# Patient Record
Sex: Female | Born: 1995 | Race: White | Hispanic: No | Marital: Married
Health system: Southern US, Academic
[De-identification: ages and names within clinical notes are randomized; demographics above are authoritative.]

## PROBLEM LIST (undated history)

## (undated) DIAGNOSIS — R6889 Other general symptoms and signs: Secondary | ICD-10-CM

## (undated) DIAGNOSIS — R233 Spontaneous ecchymoses: Secondary | ICD-10-CM

## (undated) DIAGNOSIS — T753XXA Motion sickness, initial encounter: Secondary | ICD-10-CM

## (undated) DIAGNOSIS — F419 Anxiety disorder, unspecified: Secondary | ICD-10-CM

## (undated) DIAGNOSIS — Z973 Presence of spectacles and contact lenses: Secondary | ICD-10-CM

## (undated) DIAGNOSIS — F4310 Post-traumatic stress disorder, unspecified: Secondary | ICD-10-CM

## (undated) DIAGNOSIS — J45909 Unspecified asthma, uncomplicated: Secondary | ICD-10-CM

## (undated) DIAGNOSIS — T8853XA Unintended awareness under general anesthesia during procedure, initial encounter: Secondary | ICD-10-CM

## (undated) DIAGNOSIS — F410 Panic disorder [episodic paroxysmal anxiety] without agoraphobia: Secondary | ICD-10-CM

## (undated) HISTORY — PX: HX LAP CHOLECYSTECTOMY: SHX56

## (undated) HISTORY — PX: HX UPPER ENDOSCOPY: 2100001144

## (undated) HISTORY — PX: HX WISDOM TEETH EXTRACTION: SHX21

## (undated) HISTORY — PX: HX SKIN BIOPSY: SHX1

---
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
## 2019-09-29 NOTE — ED Provider Notes (Signed)
History     Chief Complaint   Patient presents with   . Allergic Reaction     Pt. reports to UC with c/o possible allergic reaction to vegan meat that she reports she consumed at 1 pm today; Reports x 15-30 minutes ago eating, her upper lip began to feel numb and she reports noticing worsening swelling over the past few hours;      Patient is a 25 year old female who is here today for evaluation of some swelling to her lip She says ate some vegitarian meat earlier today and felt a little funny after, felt like throat was full, She says took a claritin because is all that she had, She called her sister who is a Engineer, civil (consulting) and told her to just monitor her symptoms, She says about an hour ago she felt like her lip was swelling more, She is not having any difficulty breathing or swallowing , no skin rashes She has not had any new medications She has never had something like this before          Medical/Surgical/Family History     History reviewed. No pertinent past medical history.     There is no problem list on file for this patient.           History reviewed. No pertinent surgical history.  No family history on file.       Social History     Tobacco Use   . Smoking status: Not on file   Substance Use Topics   . Alcohol use: Not on file   . Drug use: Not on file     Living Situation     Questions Responses    Patient lives with     Homeless     Caregiver for other family member     External Services     Employment     Domestic Violence Risk                  Review of Systems   Review of Systems   Constitutional: Negative for chills and fever.   HENT: Positive for facial swelling. Negative for congestion, trouble swallowing and voice change.    Respiratory: Negative for cough and shortness of breath.    Cardiovascular: Negative for chest pain and palpitations.   Skin: Negative for rash.       Physical Exam   Triage Vitals  Triage Start: Start, (09/27/19 2056)  First Recorded BP: 134/90, Resp: 18, Temp: 36.3 C (97.3  F), Temp src: TEMPORAL Oxygen Therapy SpO2: 99 %, Oximetry Source: Rt Hand, O2 Device: None (Room air), Heart Rate: 93, (09/27/19 4132)  .  First Pain Reported  0-10 Scale: 0, (09/27/19 4401)       Physical Exam  Vitals and nursing note reviewed.   Constitutional:       General: She is not in acute distress.     Appearance: Normal appearance. She is not ill-appearing.   HENT:      Head: Normocephalic.      Right Ear: Tympanic membrane, ear canal and external ear normal.      Left Ear: Tympanic membrane, ear canal and external ear normal.      Nose: Nose normal.      Mouth/Throat:      Mouth: Mucous membranes are moist.      Pharynx: Oropharynx is clear. No posterior oropharyngeal erythema.      Comments: Swelling to right upper lip  Eyes:  Extraocular Movements: Extraocular movements intact.      Pupils: Pupils are equal, round, and reactive to light.   Cardiovascular:      Rate and Rhythm: Normal rate and regular rhythm.      Pulses: Normal pulses.   Pulmonary:      Effort: Pulmonary effort is normal.      Breath sounds: Normal breath sounds.   Musculoskeletal:      Cervical back: Normal range of motion and neck supple.   Skin:     Findings: No rash.   Neurological:      General: No focal deficit present.      Mental Status: She is alert and oriented to person, place, and time.          Medical Decision Making        Initial Evaluation:  ED First Provider Contact     Date/Time Event User Comments    09/27/19 2052 ED First Provider Contact VITALE, MARIE Initial Face to Face Provider Contact            Patient was seen on: 09/27/2019        Assessment:  24 y.o.female comes [facility] the [facility] with some facial swelling after eating a new vegan meat    Differential Diagnosis includes:  Allergic reaction  Angioedema  Cellulitis    Plan: Patient given prednisone and benadry, Will continue prednisone for additional 4 days and patient advised to take benadryl for the next 2 days, She has been referred to Allergy,  She is advised to go to ER if swelling worsens or develops difficulty swallowing or breathing        Orders Placed This Encounter   . ED/UC REFERRAL TO ALLERGY/IMMUNOLOGY   . predniSONE (DELTASONE) tablet 40 mg   . diphenhydrAMINE (BENADRYL) oral solid 25 mg   . predniSONE (DELTASONE) 20 MG tablet       No results found for this or any previous visit (from the past 24 hour(s)).        Final Diagnosis    ICD-10-CM ICD-9-CM   1. Allergic reaction, initial encounter  T78.40XA 995.3   2. Angioedema, initial encounter  T78.3XXA 995.1       Encourage fluids, encourage rest, good hand hygiene.    Use over the counter medications as discussed.    Please start the new medications as below:    Current Discharge Medication List      New Medications    Details Last Dose Given Next Dose Due Script Given?   predniSONE (DELTASONE) 40 mg Dose: 40 mg  Take 40 mg by mouth daily  Quantity 8 tablet, Refill 0  Start date: 09/27/2019, End date: 10/01/2019       Comments: Emergency Encounter                   Please follow up with your physician as below:        Thank you Yvonne Merritt for coming to [facility] for your health care concerns.    If your condition changes and/or worsens please follow up with her primary doctor and/or return to the urgent care center.    If short of breath, chest pains or any other concerns please report to the emergency room.    In the event of an Emergency dial 911.    Final Diagnosis  Final diagnoses:   [T78.40XA] Allergic reaction, initial encounter (Primary)   [T78.3XXA] Angioedema, initial encounter  Delorise Jackson, MD              Delorise Jackson, MD  09/29/19 [phone]

## 2020-10-22 ENCOUNTER — Emergency Department
Admission: EM | Admit: 2020-10-22 | Discharge: 2020-10-23 | Disposition: A | Discharge status: Home / Self Care | Payer: PRIVATE HEALTH INSURANCE | Attending: Emergency Medicine | Admitting: Emergency Medicine

## 2020-10-22 ENCOUNTER — Encounter: Payer: Self-pay | Admitting: Emergency Medicine

## 2020-10-22 ENCOUNTER — Emergency Department: Payer: PRIVATE HEALTH INSURANCE

## 2020-10-22 DIAGNOSIS — W138XXA Fall from, out of or through other building or structure, initial encounter: Secondary | ICD-10-CM | POA: Diagnosis not present

## 2020-10-22 DIAGNOSIS — S060X0A Concussion without loss of consciousness, initial encounter: Secondary | ICD-10-CM | POA: Diagnosis not present

## 2020-10-22 DIAGNOSIS — S0990XA Unspecified injury of head, initial encounter: Secondary | ICD-10-CM | POA: Diagnosis present

## 2020-10-22 MED ORDER — IBUPROFEN 800 MG PO TABS
800.0000 mg | ORAL_TABLET | Freq: Once | ORAL | Status: AC
  Administered 2020-10-23: 800 mg via ORAL
  Filled 2020-10-22: qty 1

## 2020-10-22 NOTE — ED Triage Notes (Signed)
Pt fell at 1430, off a performance stage, approx 83ft fall, hitting the posterior head. Denies LOC. Pt to ED due to pain and discomfort in neck and head. Denies blurred vision.

## 2020-10-22 NOTE — ED Provider Notes (Signed)
[facility] EMERGENCY DEPARTMENT Provider Note   CSN: [id number] Arrival date & time: 10/22/20  2200     History Chief Complaint  Patient presents with  . Head Injury    Sandra Yu is a 25 y.o. female presents to the emergency department for evaluation of head injury that occurred at approximately 2:30 PM today.  Patient states she was on a stage proximately 3 feet high, fell backwards after stepping off the edge and landed on her buttocks/back and hit her head.  She denies any back lower extremity or upper extremity discomfort.  She complains of a mild headache with mild nausea.  No LOC or vomiting.  She has not had a medications for her headache.  She does have a little bit of neck tightness but no radicular symptoms in the upper extremities.  HPI     History reviewed. No pertinent past medical history.  There are no problems to display for this patient.   History reviewed. No pertinent surgical history.   OB History   No obstetric history on file.     History reviewed. No pertinent family history.  Social History   Tobacco Use  . Smoking status: Never Smoker  . Smokeless tobacco: Never Used    Home Medications Prior to Admission medications   Not on File    Allergies    Patient has no known allergies.  Review of Systems   Review of Systems  Constitutional: Negative for fever.  Eyes: Positive for photophobia. Negative for visual disturbance.  Respiratory: Negative for shortness of breath.   Cardiovascular: Negative for chest pain.  Gastrointestinal: Positive for nausea. Negative for vomiting.  Musculoskeletal: Positive for myalgias and neck pain. Negative for arthralgias and back pain.  Skin: Negative for rash and wound.  Neurological: Positive for headaches. Negative for dizziness, tremors, syncope, facial asymmetry, light-headedness and numbness.    Physical Exam Updated Vital Signs BP (!) 124/91 (BP Location: Right  Arm)   Pulse 88   Temp 97.9 F (36.6 C) (Oral)   Resp 18   SpO2 100%   Physical Exam Constitutional:      Appearance: She is well-developed.  HENT:     Head: Normocephalic and atraumatic.     Comments: No bruising or tenderness to palpation.    Right Ear: External ear normal.     Left Ear: External ear normal.  Eyes:     Extraocular Movements: Extraocular movements intact.     Conjunctiva/sclera: Conjunctivae normal.     Pupils: Pupils are equal, round, and reactive to light.  Cardiovascular:     Rate and Rhythm: Normal rate.     Pulses: Normal pulses.     Heart sounds: Normal heart sounds.  Pulmonary:     Effort: Pulmonary effort is normal. No respiratory distress.  Abdominal:     General: There is no distension.     Tenderness: There is no abdominal tenderness. There is no guarding.  Musculoskeletal:        General: Normal range of motion.     Cervical back: Normal range of motion and neck supple. No rigidity or tenderness.  Skin:    General: Skin is warm.     Findings: No rash.  Neurological:     General: No focal deficit present.     Mental Status: She is alert and oriented to person, place, and time. Mental status is at baseline.     Cranial Nerves: No cranial nerve deficit.  Motor: No weakness.     Gait: Gait normal.  Psychiatric:        Mood and Affect: Mood normal.        Behavior: Behavior normal.        Thought Content: Thought content normal.     ED Results / Procedures / Treatments   Labs (all labs ordered are listed, but only abnormal results are displayed) Labs Reviewed - No data to display  EKG None  Radiology CT Head Wo Contrast  Result Date: 10/22/2020 CLINICAL DATA:  Head trauma during a fall, striking the posterior head. Pain in the neck and head. EXAM: CT HEAD WITHOUT CONTRAST CT CERVICAL SPINE WITHOUT CONTRAST TECHNIQUE: Multidetector CT imaging of the head and cervical spine was performed following the standard protocol without  intravenous contrast. Multiplanar CT image reconstructions of the cervical spine were also generated. COMPARISON:  None. FINDINGS: CT HEAD FINDINGS Brain: No evidence of acute infarction, hemorrhage, hydrocephalus, extra-axial collection or mass lesion/mass effect. Vascular: No hyperdense vessel or unexpected calcification. Skull: The calvarium appears intact. Sinuses/Orbits: Paranasal sinuses and mastoid air cells are clear. Other: None. CT CERVICAL SPINE FINDINGS Alignment: Reversal of the usual cervical lordosis without anterior subluxation. This may be positional but muscle spasm could also have this appearance. Normal alignment of the posterior elements. C1-2 articulation appears intact. Skull base and vertebrae: No acute fracture. No primary bone lesion or focal pathologic process. Soft tissues and spinal canal: No prevertebral fluid or swelling. No visible canal hematoma. Disc levels:  Intervertebral disc space heights are normal. Upper chest: Visualized lung apices are clear. Other: None. IMPRESSION: 1. No acute intracranial abnormalities. 2. Nonspecific reversal of the usual cervical lordosis. No acute displaced fractures identified. Electronically Signed   By: Burman Nieves M.D.   On: 10/22/2020 23:02   CT Cervical Spine Wo Contrast  Result Date: 10/22/2020 CLINICAL DATA:  Head trauma during a fall, striking the posterior head. Pain in the neck and head. EXAM: CT HEAD WITHOUT CONTRAST CT CERVICAL SPINE WITHOUT CONTRAST TECHNIQUE: Multidetector CT imaging of the head and cervical spine was performed following the standard protocol without intravenous contrast. Multiplanar CT image reconstructions of the cervical spine were also generated. COMPARISON:  None. FINDINGS: CT HEAD FINDINGS Brain: No evidence of acute infarction, hemorrhage, hydrocephalus, extra-axial collection or mass lesion/mass effect. Vascular: No hyperdense vessel or unexpected calcification. Skull: The calvarium appears intact.  Sinuses/Orbits: Paranasal sinuses and mastoid air cells are clear. Other: None. CT CERVICAL SPINE FINDINGS Alignment: Reversal of the usual cervical lordosis without anterior subluxation. This may be positional but muscle spasm could also have this appearance. Normal alignment of the posterior elements. C1-2 articulation appears intact. Skull base and vertebrae: No acute fracture. No primary bone lesion or focal pathologic process. Soft tissues and spinal canal: No prevertebral fluid or swelling. No visible canal hematoma. Disc levels:  Intervertebral disc space heights are normal. Upper chest: Visualized lung apices are clear. Other: None. IMPRESSION: 1. No acute intracranial abnormalities. 2. Nonspecific reversal of the usual cervical lordosis. No acute displaced fractures identified. Electronically Signed   By: Burman Nieves M.D.   On: 10/22/2020 23:02    Procedures Procedures   Medications Ordered in ED Medications  ibuprofen (ADVIL) tablet 800 mg (has no administration in time range)    ED Course  I have reviewed the triage vital signs and the nursing notes.  Pertinent labs & imaging results that were available during my care of the patient were reviewed by me and  considered in my medical decision making (see chart for details).    MDM Rules/Calculators/A&P                          25 year old female with mild concussion after accidentally stepping off a stage and falling backwards hitting her head.  No LOC or vomiting.  She has had mild nausea.  Slight headache is exacerbated with looking at her phone/screen.  Symptoms improved by discontinuing looking at a screen.  She is given ibuprofen.  She is encouraged to alternate Tylenol and ibuprofen.  She is educated on concussion and treatment/things to avoid.  She understands signs and symptoms return to the ER for.   Final Clinical Impression(s) / ED Diagnoses Final diagnoses:  Concussion without loss of consciousness, initial encounter     Rx / DC Orders ED Discharge Orders    None       Ronnette Juniper 10/22/20 2358    Dionne Bucy, MD 10/23/20 0020

## 2020-10-22 NOTE — Discharge Instructions (Signed)
Please alternate Tylenol and ibuprofen as needed for headache.  Avoid bright lights, screen time, loud sounds.  Do not drive for 24 hours.  Return to the ER for any sudden onset of severe headache, vomiting, vision changes, worsening symptoms or urgent changes in her health.

## 2021-06-13 DIAGNOSIS — D649 Anemia, unspecified: Secondary | ICD-10-CM | POA: Insufficient documentation

## 2022-07-02 ENCOUNTER — Emergency Department (HOSPITAL_COMMUNITY): Payer: Self-pay

## 2022-07-02 ENCOUNTER — Emergency Department (EMERGENCY_DEPARTMENT_HOSPITAL): Payer: BC Managed Care – PPO

## 2022-07-02 ENCOUNTER — Emergency Department
Admission: EM | Admit: 2022-07-02 | Discharge: 2022-07-02 | Disposition: A | Discharge status: Home / Self Care | Payer: BC Managed Care – PPO | Attending: Emergency Medicine | Admitting: Emergency Medicine

## 2022-07-02 ENCOUNTER — Encounter (HOSPITAL_COMMUNITY): Payer: Self-pay | Admitting: Student in an Organized Health Care Education/Training Program

## 2022-07-02 ENCOUNTER — Other Ambulatory Visit: Payer: Self-pay

## 2022-07-02 DIAGNOSIS — R519 Headache, unspecified: Secondary | ICD-10-CM | POA: Insufficient documentation

## 2022-07-02 DIAGNOSIS — M549 Dorsalgia, unspecified: Secondary | ICD-10-CM

## 2022-07-02 LAB — CBC WITH DIFF
BASOPHIL #: <0.1 10*3/uL (ref <=0.20)
BASOPHIL %: 0.6 %
EOSINOPHIL #: 0.19 10*3/uL (ref <=0.50)
EOSINOPHIL %: 2.6 %
HCT: 34.4 % — ABNORMAL LOW (ref 34.8–46.0)
HGB: 11.1 g/dL — ABNORMAL LOW (ref 11.5–16.0)
IMMATURE GRANULOCYTE #: <0.1 10*3/uL (ref <0.10)
IMMATURE GRANULOCYTE %: 0.1 % (ref 0.0–1.0)
LYMPHOCYTE #: 2.02 10*3/uL (ref 1.00–4.80)
LYMPHOCYTE %: 28 %
MCH: 27.4 pg (ref 26.0–32.0)
MCHC: 32.3 g/dL (ref 31.0–35.5)
MCV: 84.9 fL (ref 78.0–100.0)
MONOCYTE #: 0.61 10*3/uL (ref 0.20–1.10)
MONOCYTE %: 8.4 %
MPV: 11.7 fL (ref 8.7–12.5)
NEUTROPHIL #: 4.35 10*3/uL (ref 1.50–7.70)
NEUTROPHIL %: 60.3 %
PLATELETS: 191 10*3/uL (ref 150–400)
RBC: 4.05 10*6/uL (ref 3.85–5.22)
RDW-CV: 13.1 % (ref 11.5–15.5)
WBC: 7.2 10*3/uL (ref 3.7–11.0)

## 2022-07-02 LAB — BASIC METABOLIC PANEL
ANION GAP: 6 mmol/L (ref 4–13)
BUN/CREA RATIO: 16 (ref 6–22)
BUN: 10 mg/dL (ref 8–25)
CALCIUM: 9.1 mg/dL (ref 8.6–10.2)
CHLORIDE: 111 mmol/L (ref 96–111)
CO2 TOTAL: 22 mmol/L (ref 22–30)
CREATININE: 0.64 mg/dL (ref 0.60–1.05)
ESTIMATED GFR - FEMALE: >90 mL/min/BSA (ref >=60)
GLUCOSE: 84 mg/dL (ref 65–125)
POTASSIUM: 3.7 mmol/L (ref 3.5–5.1)
SODIUM: 139 mmol/L (ref 136–145)

## 2022-07-02 LAB — HCG (FOR ED ONLY): HCG QUANTITATIVE PREGNANCY: <2 m[IU]/mL (ref <5)

## 2022-07-02 MED ORDER — PROCHLORPERAZINE EDISYLATE 10 MG/2 ML (5 MG/ML) INJECTION SOLUTION
10.0000 mg | INTRAMUSCULAR | Status: AC
Start: 2022-07-02 — End: 2022-07-02
  Administered 2022-07-02: 10 mg via INTRAVENOUS
  Filled 2022-07-02: qty 2

## 2022-07-02 MED ORDER — SODIUM CHLORIDE 0.9 % IV BOLUS
1000.0000 mL | INJECTION | Status: AC
Start: 2022-07-02 — End: 2022-07-02
  Administered 2022-07-02: 1000 mL via INTRAVENOUS
  Administered 2022-07-02: 0 mL via INTRAVENOUS

## 2022-07-02 MED ORDER — ACETAMINOPHEN 325 MG TABLET
975.0000 mg | ORAL_TABLET | ORAL | Status: AC
Start: 2022-07-02 — End: 2022-07-02
  Administered 2022-07-02: 975 mg via ORAL
  Filled 2022-07-02: qty 3

## 2022-07-02 MED ORDER — DIPHENHYDRAMINE 50 MG/ML INJECTION SOLUTION
25.0000 mg | INTRAMUSCULAR | Status: AC
Start: 2022-07-02 — End: 2022-07-02
  Administered 2022-07-02: 25 mg via INTRAVENOUS
  Filled 2022-07-02: qty 1

## 2022-07-02 MED ORDER — IOPAMIDOL 370 MG IODINE/ML (76 %) INTRAVENOUS SOLUTION
100.0000 mL | INTRAVENOUS | Status: AC
Start: 2022-07-02 — End: 2022-07-02
  Administered 2022-07-02: 100 mL via INTRAVENOUS

## 2022-07-02 NOTE — ED Provider Notes (Signed)
[facility] - Emergency Department  ED Primary Provider Note  History of Present Illness   Chief Complaint   Patient presents with    Headache     Headache began last night. Noticed a "bump" on right side of back of neck. Became nauseous around midnight, felt a "pop" around 0230 in her neck. No vomiting. Pain on right side of head. Hx of snow vision and states "Colors became dilute" in left eye. Pt states left eye feels worse than normal with vision.     Drew Herman is a 27 y.o. female who had concerns including Headache.  Arrival: The patient arrived by Car    27 year old female with past medical history of migraines who presents today for headache.  Patient states that several hours prior to arrival experienced a headache which was preceded by a pain on the right posterior aspect over neck.  Patient states that she felt a popping sensation and then began to have a tingling sensation running up the posterior right aspect of her head.  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  Otherwise reports intermittent tingling in the right upper extremity.  No nausea, vomiting, fever, bowel or bladder incontinence.  No rash.  Patient attempted take baby aspirin prior to arrival without improvement in headache.  She states that the headache comes and goes in waves of pain and associated nausea.  Worsening since time of onset.      History Reviewed This Encounter: Medical History  Surgical History  Family History  Social History    Physical Exam   ED Triage Vitals [07/02/22 0354]   BP (Non-Invasive) 134/87   Heart Rate (!) 112   Respiratory Rate 17   Temperature 36.8 C (98.2 F)   SpO2 100 %   Weight 80.1 kg (176 lb 9.4 oz)   Height 1.676 m (5\' 6" )     Physical Exam  Vitals reviewed.   Constitutional:       Appearance: Normal appearance.   HENT:      Head: Normocephalic and  atraumatic.      Mouth/Throat:      Mouth: Mucous membranes are moist.      Pharynx: Oropharynx is clear.   Eyes:      Extraocular Movements: Extraocular movements intact.      Pupils: Pupils are equal, round, and reactive to light.   Neck:      Comments: Right sided paraspinal muscle tenderness  Posterior chain lymphadenopathy  Cardiovascular:      Rate and Rhythm: Normal rate and regular rhythm.      Heart sounds: Normal heart sounds.   Pulmonary:      Effort: Pulmonary effort is normal.      Breath sounds: Normal breath sounds.   Abdominal:      General: Abdomen is flat. There is no distension.      Palpations: Abdomen is soft. There is no mass.      Tenderness: There is no abdominal tenderness.   Musculoskeletal:         General: No signs of injury.      Cervical back: Normal range of motion. Tenderness present.   Skin:     General: Skin is warm and dry.   Neurological:      General: No focal deficit present.      Mental Status: She is  alert and oriented to person, place, and time.      Cranial Nerves: No cranial nerve deficit.      Sensory: No sensory deficit.      Motor: No weakness.       Patient Data     Labs Ordered/Reviewed   CBC WITH DIFF - Abnormal; Notable for the following components:       Result Value    HGB 11.1 (*)     HCT 34.4 (*)     All other components within normal limits   BASIC METABOLIC PANEL - Normal   HCG (FOR ED ONLY) - Normal   CBC/DIFF    Narrative:     The following orders were created for panel order CBC/DIFF.  Procedure                               Abnormality         Status                     ---------                               -----------         ------                     CBC WITH WFUX[[id number]]                Abnormal            Final result                 Please view results for these tests on the individual orders.     CT ANGIO CAROTID-EXTRACRANIAL (NECK) W IV CONTRAST   Final Result by Edi, Radresults In (01/01 0757)   No high-grade stenosis or large vessel occlusion  in the included extracranial arterial circulation.            CT ANGIO INTRACRANIAL W/WO IV CONTRAST   Final Result by Edi, Radresults In (01/01 0755)   1. No acute intracranial abnormality on noncontrast CT.   2. No high-grade stenosis or large vessel occlusion in the included intracranial arterial circulation.              Medical Decision Making        Medical Decision Making  Following below course patient reported significant improvement in headache. Continued to have muscular neck pain. Recommend tylenol/ibuprofen for pain control. Heat therapy and stretching as needed. Return precautions for worsening headache, changes in vision or hearing, weakness or numbness, discussed with patient who verbalized understanding.    Amount and/or Complexity of Data Reviewed  Labs: ordered.  Radiology: ordered.  ECG/medicine tests: independent interpretation performed.    Risk  OTC drugs.  Prescription drug management.      ED Course as of 07/02/22 2303   Mon Jul 03, 6787   7785 27 year old female with past medical history of migraines who presents today for headache.  Patient states that several hours prior to arrival experienced a headache which was preceded by a pain on the right posterior aspect over neck.  Patient states that she felt a popping sensation and then began to have a tingling sensation running up the posterior right aspect of her head.  Patient also reports that she had worsening of chronic "snow vision" in  her left eye and decreased perception of color in the left eye both of which have now resolved.  Otherwise reports intermittent tingling in the right upper extremity.  No nausea, vomiting, fever, bowel or bladder incontinence.  No rash.  Patient attempted take baby aspirin prior to arrival without improvement in headache.  She states that the headache comes and goes in waves of pain and associated nausea.  Worsening since time of onset.   0630 WBC: 7.2   0630 HGB(!): 11.1   0630 HCG QUANTITATIVE/PREG: <2    0630 SODIUM: 139   0630 POTASSIUM: 3.7   0630 CREATININE: 0.64         Medications Administered in the ED   NS bolus infusion 1,000 mL (0 mL Intravenous Stopped 07/02/22 0617)   acetaminophen (TYLENOL) tablet (975 mg Oral Given 07/02/22 0515)   prochlorperazine (COMPAZINE) 5 mg/mL injection (10 mg Intravenous Given 07/02/22 0523)   diphenhydrAMINE (BENADRYL) 50 mg/mL injection (25 mg Intravenous Given 07/02/22 0522)   iopamidol (ISOVUE-370) 76% infusion (100 mL Intravenous Given 07/02/22 1610)     Clinical Impression   Headache (Primary)       Disposition: Discharged         Gwen Her, MD

## 2022-07-02 NOTE — ED Nurses Note (Signed)
Discharge instructions explained by MD. Questions and concerns addressed.  Vital signs updated and stable.  PIV removed without complications.  Belongings with patient.  Ambulatory from ED upon discharge.

## 2022-07-02 NOTE — ED Attending Note (Signed)
Please see my attestation to the ED Primary Note for my documentation of this encounter.

## 2022-07-02 NOTE — ED Nurses Note (Signed)
Pt arrived to ED c/o headache with pressure and neck pain. Pt states was laying in bed when felt a "pop" in neck, began having sharp headache. +nausea, denies vision changes.

## 2022-07-02 NOTE — Discharge Instructions (Signed)
Seen and evaluated in the emergency room for headache.  No abnormal pathology of the brain or vasculature seen on CT.  Recommend ibuprofen 800 mg alternating with Tylenol 1000 mg  3 hours later for symptom control.  Additionally recommend heat therapy to the area of the neck that continues to be sore.  Otherwise can try stretching of the trapezius muscle.  Return to the emergency room for worsening or uncontrolled headache, changes in vision or hearing, weakness or numbness, or development of new symptoms.

## 2022-07-02 NOTE — ED Nurses Note (Signed)
Handoff report given to oncoming RN, transferring care at this time.

## 2022-07-27 IMAGING — CT CT HEAD W/O CM
3 series · 16 of 47 positions shown, 19 images · non-contrast
Comparison: None.

CLINICAL DATA: Head trauma during a fall, striking the posterior
head. Pain in the neck and head.

EXAM:
CT HEAD WITHOUT CONTRAST
CT CERVICAL SPINE WITHOUT CONTRAST
TECHNIQUE: Multidetector CT imaging of the head and cervical spine was
performed following the standard protocol without intravenous
contrast. Multiplanar CT image reconstructions of the cervical spine
were also generated.

[Series 2: head wo · axial · 0.42mm/px · z∈[-193,-68]mm · 10 of 31 slices shown, 13 images]
[im 3/31  brain]
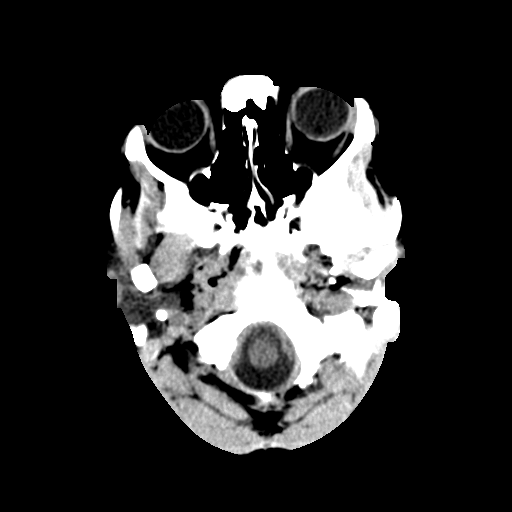
[im 3/31  bone]
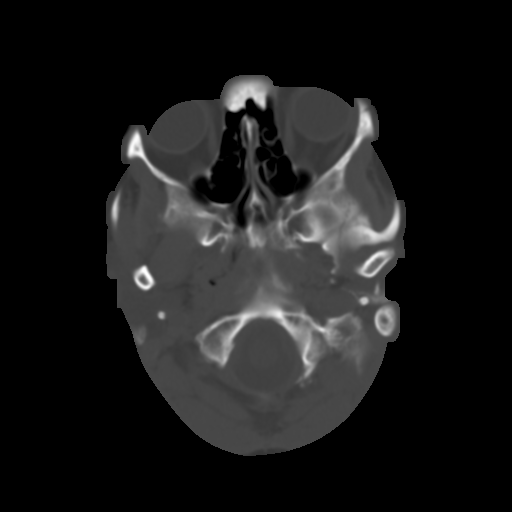
[im 6/31  brain]
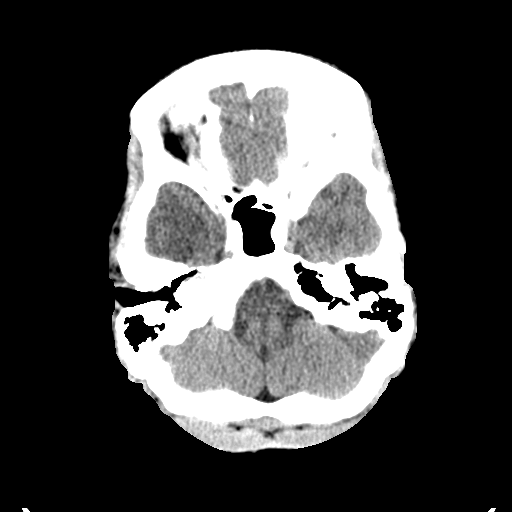
[im 9/31  brain]
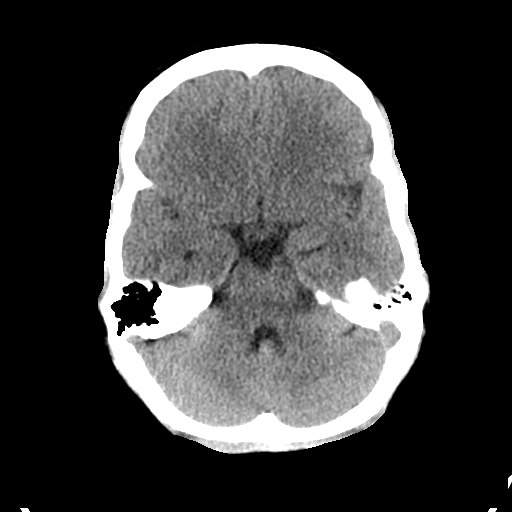
[im 11/31  brain]
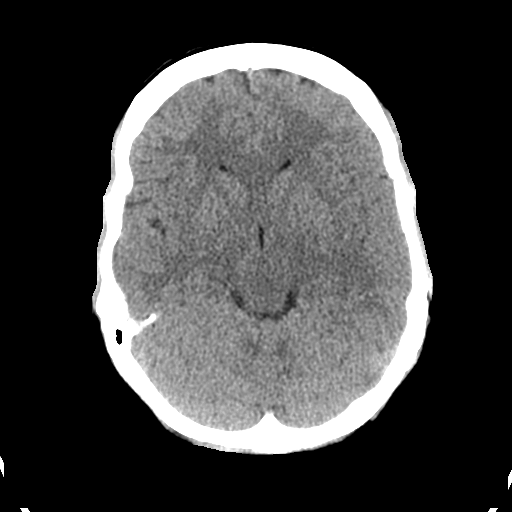
[im 14/31  brain]
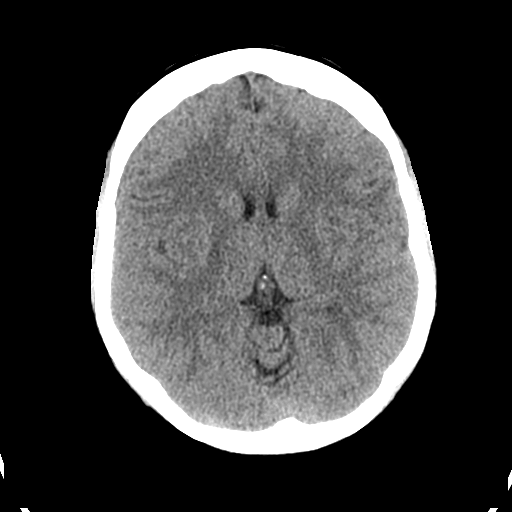
[im 14/31  bone]
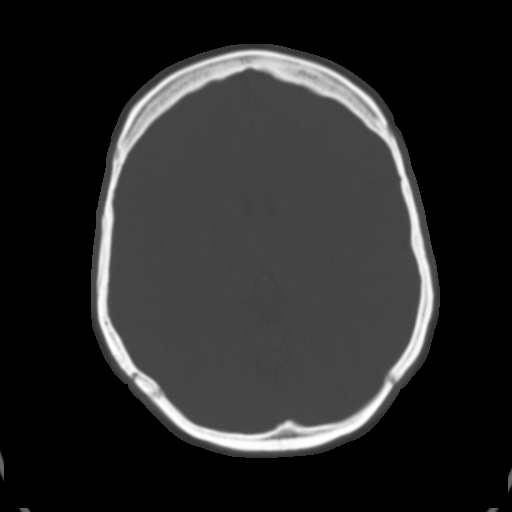
[im 17/31  brain]
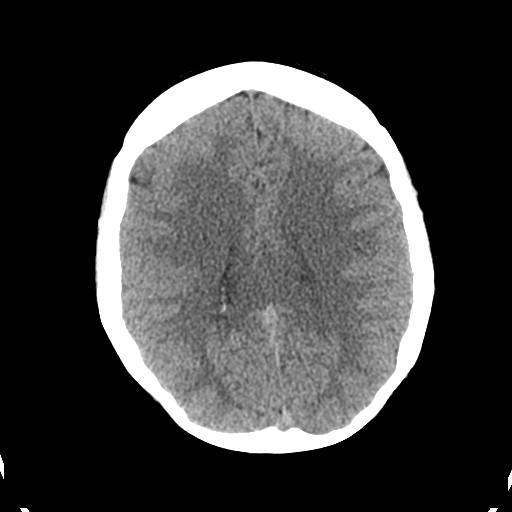
[im 20/31  brain]
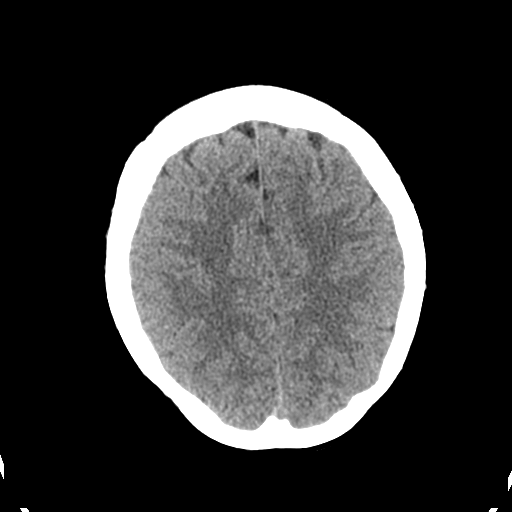
[im 23/31  brain]
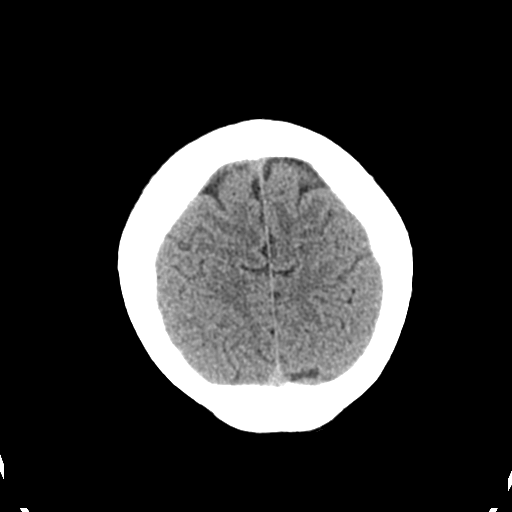
[im 25/31  brain]
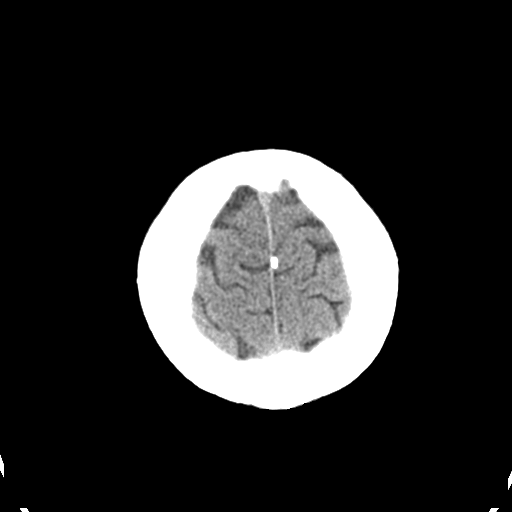
[im 25/31  bone]
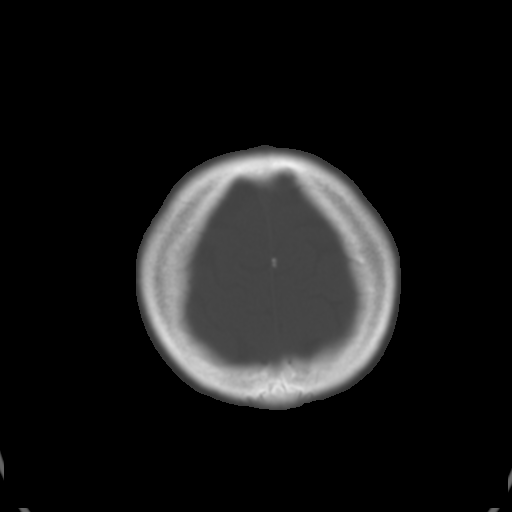
[im 28/31  brain]
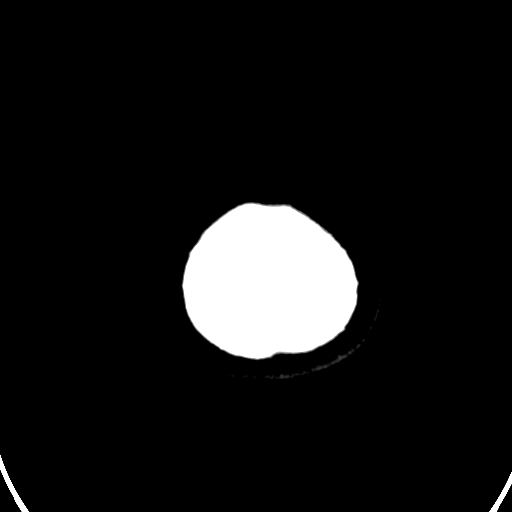

[Series 4: coronal soft tissue · coronal · 0.33mm/px · 3 of 65 slices shown]
[im 22/65  brain]
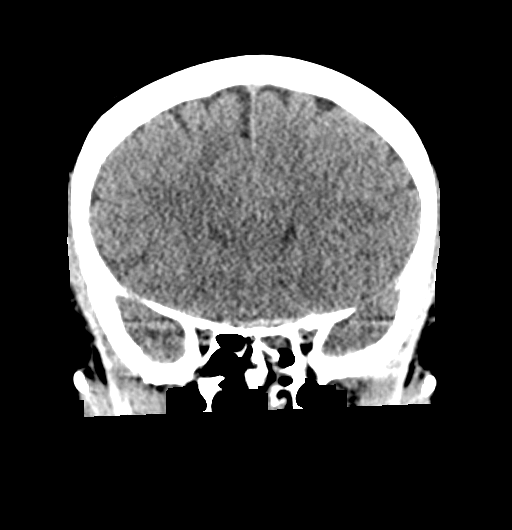
[im 29/65  brain]
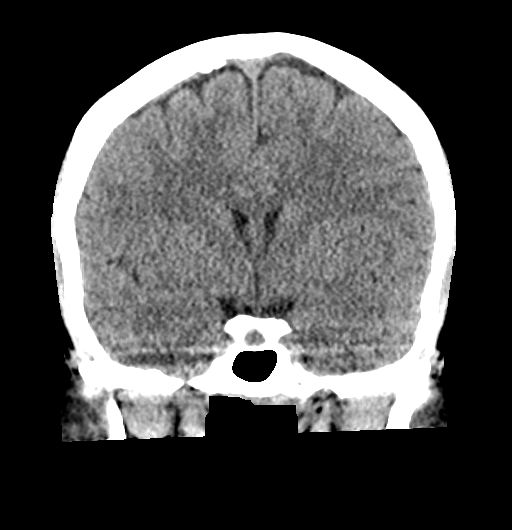
[im 36/65  brain]
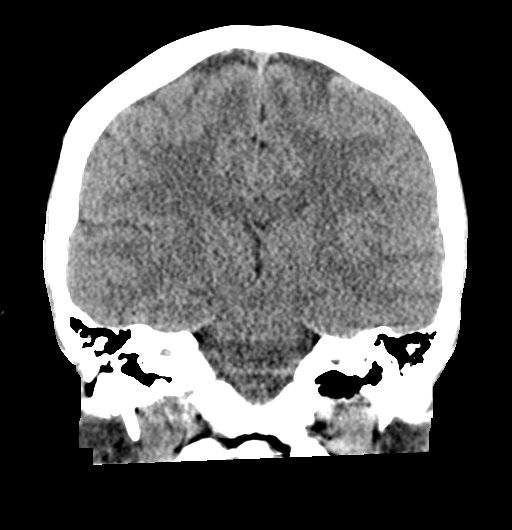

[Series 5: sagittal soft tissue · sagittal · 0.34mm/px · 3 of 59 slices shown]
[im 20/59  brain]
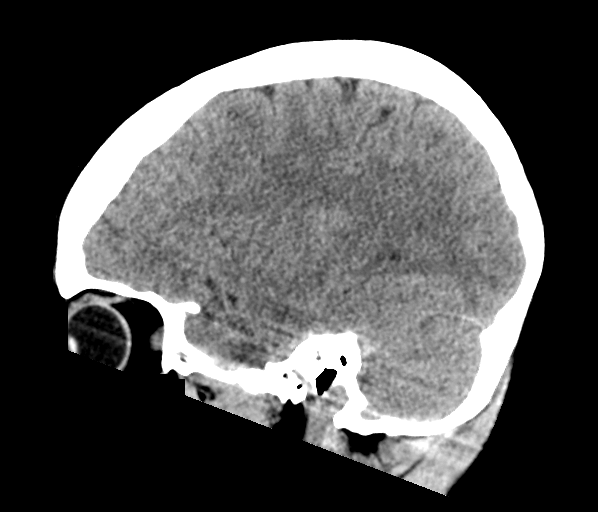
[im 30/59  brain]
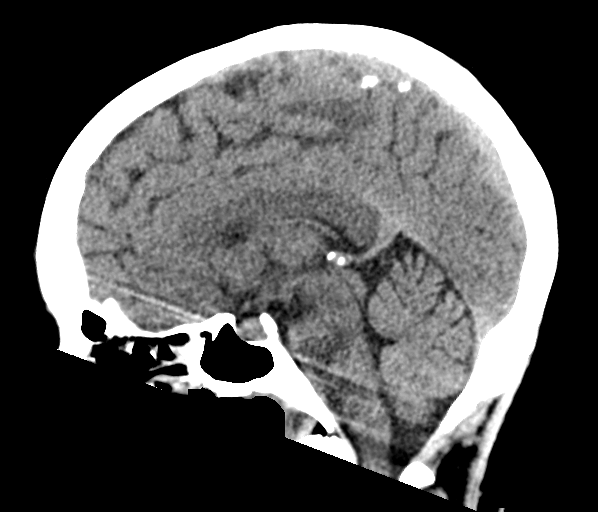
[im 39/59  brain]
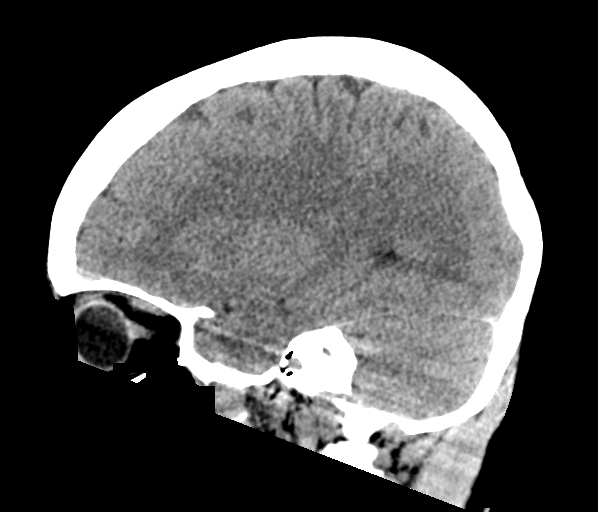

[16 of 47 positions shown; findings below may reference images not displayed]

FINDINGS: CT HEAD FINDINGS

Brain: No evidence of acute infarction, hemorrhage, hydrocephalus,
extra-axial collection or mass lesion/mass effect.

Vascular: No hyperdense vessel or unexpected calcification.

Skull: The calvarium appears intact.

Sinuses/Orbits: Paranasal sinuses and mastoid air cells are clear.

Other: None.

CT CERVICAL SPINE FINDINGS

Alignment: Reversal of the usual cervical lordosis without anterior
subluxation. This may be positional but muscle spasm could also have
this appearance. Normal alignment of the posterior elements. C1-2
articulation appears intact.

Skull base and vertebrae: No acute fracture. No primary bone lesion
or focal pathologic process.

Soft tissues and spinal canal: No prevertebral fluid or swelling. No
visible canal hematoma.

Disc levels:  Intervertebral disc space heights are normal.

Upper chest: Visualized lung apices are clear.

Other: None.
IMPRESSION: 1. No acute intracranial abnormalities.
2. Nonspecific reversal of the usual cervical lordosis. No acute
displaced fractures identified.

## 2023-02-05 ENCOUNTER — Emergency Department
Admission: EM | Admit: 2023-02-05 | Discharge: 2023-02-05 | Disposition: A | Discharge status: Home / Self Care | Payer: Auto Insurance (includes no fault) | Attending: Emergency Medicine | Admitting: Emergency Medicine

## 2023-02-05 ENCOUNTER — Other Ambulatory Visit: Payer: Self-pay

## 2023-02-05 ENCOUNTER — Emergency Department (HOSPITAL_COMMUNITY): Payer: Auto Insurance (includes no fault)

## 2023-02-05 ENCOUNTER — Encounter (HOSPITAL_COMMUNITY): Payer: Self-pay

## 2023-02-05 DIAGNOSIS — R519 Headache, unspecified: Secondary | ICD-10-CM

## 2023-02-05 DIAGNOSIS — M542 Cervicalgia: Secondary | ICD-10-CM

## 2023-02-05 DIAGNOSIS — S161XXA Strain of muscle, fascia and tendon at neck level, initial encounter: Secondary | ICD-10-CM
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
## 2023-02-05 NOTE — ED Nurses Note (Signed)
Pt to CT

## 2023-02-05 NOTE — ED Nurses Note (Signed)
AVS reviewed, discharge and follow up instructions explained to patient. Discharged to home, condition stable and ambulatory on departure.

## 2024-03-13 ENCOUNTER — Ambulatory Visit (INDEPENDENT_AMBULATORY_CARE_PROVIDER_SITE_OTHER)

## 2024-03-13 ENCOUNTER — Other Ambulatory Visit: Payer: Self-pay

## 2024-03-13 ENCOUNTER — Other Ambulatory Visit

## 2024-03-13 ENCOUNTER — Encounter (INDEPENDENT_AMBULATORY_CARE_PROVIDER_SITE_OTHER): Payer: Self-pay

## 2024-03-13 VITALS — BP 107/72 | HR 92 | Temp 98.1°F | Ht 67.0 in | Wt 200.0 lb

## 2024-03-13 DIAGNOSIS — N939 Abnormal uterine and vaginal bleeding, unspecified: Secondary | ICD-10-CM | POA: Insufficient documentation

## 2024-03-13 DIAGNOSIS — N946 Dysmenorrhea, unspecified: Secondary | ICD-10-CM

## 2024-03-13 DIAGNOSIS — Z01419 Encounter for gynecological examination (general) (routine) without abnormal findings: Secondary | ICD-10-CM

## 2024-03-13 NOTE — Patient Instructions (Signed)
 Please make an appointment for pelvic uS  and follow up telemedicine to discuss test results.     Thank you! :)

## 2024-03-13 NOTE — Progress Notes (Signed)
 ANNUAL EXAM    28 y.o. presenting for well-person care.    Issues today: irregular periods. Took a pregnancy test at home and it was negative.   Menarche age 56, notes that she was getting periods every 2 weeks and periods were lasting 7 to 10 days. She saw a gynecologist in college and was placed on OCPs for three years. Reported that she was still having irregular cycles on pills, so stopped. She has always had a lot of pain and heavy bleeding - some days, can't even walk. She reports an episode in July 2024 where she bled for 6 weeks straight - heavy bleeding for three weeks, gradually stopped. Reports that more recently, bleeding every 28 days. No bleeding in between. On her heaviest day, she uses largest size pad and changes about 6 to 7 pads per day. This lasts 3 to 5 days with 2-5 days of spotting. She does pass blood clots with her periods. Reports family history of endometriosis and PCOS, fibroids.     She does get cystic acne around her cycles on her face and notes that she has cystic pimples around her pubic area.     Hoping to try to conceive in 6 months.     Notes a bump on her vulva-- if pushed, can feel pressure all thew way down her leg. Can be uncomfortable during sex.     Notes dyschezia and dysuria with periods.       Partners - one partner  History of STI - none   Contraception - previously using spermicide, recently stopped     Last pap: 09/03/2019, NIL      Past Medical History:  All were reviewed and updated in the EMR.  Past Surgical History:   Procedure Laterality Date    HX LAP CHOLECYSTECTOMY      HX SKIN BIOPSY      inner thigh, labia         No past medical history on file.    Current Outpatient Medications   Medication Sig    magnesium oxide (MAG-OX) 400 mg Oral Tablet Take 1 Tablet (400 mg total) by mouth Daily    OTC/HERBAL SUPPLEMENT     prenatal vitamin-iron-folate Tablet Take 1 Tablet by mouth Daily     Allergies   Allergen Reactions    Naproxen      Severe depression    Environmental  Mung beans - anaphlactic    Ob/Gyn Hx:    OB History       Gravida   0    Para   0    Term   0    Preterm   0    AB   0    Living   0         SAB   0    IAB   0    Ectopic   0    Multiple   0    Live Births   0               Social History     Substance and Sexual Activity   Sexual Activity Not on file       Social History:  Social alcohol, no tobacco or drugs.    Lives with husband, works at the [employer] in [place], Tree surgeon. Feels safe at home.  Wears her seatbelt and gets calcium in her diet.     Family History:  Family Medical History:  None     As above, history of endometriosis and fibroids in her family.   HTN - mother  Heart problems - paternal grandparents     Denies family history of cancers of colon, breast, ovary. Denies family history of blood clots, diabetes, hypertension.    ROS:  Constitutional:  No unexplained weight gain or loss, no loss of appetite, no fever, no night sweats or chills, no pain in jaws when eating, no scalp tenderness,  Ears nose mouth and throat:  No difficulty with hearing, no sinus problems, no runny nose, no postnasal drip, no ringing ears, no mouth sores, loose teeth, ear pain nose bleeds, sore throat facial pain or numbness.  Cardiovascular no irregular heart beats, racing heartbeat, chest pains, swelling of feet or legs, no pain in legs with walking.  Respiratory no shortness of breath, no night sweats, no prolonged cough, wheezing, sputum production, no hemoptysis.  Gastrointestinal no heartburn, constipation, intolerance to foods, diarrhea abdominal pain, difficulty swallowing, nausea, vomiting, and no change in bowel movements.  Integument:  No rash, itching, skin lesions, change in hair.  Neurologic no frequent headaches, double vision, weakness, changes in station, problems with walking or balance, dizziness, tremor, loss of consciousness no visual loss.  Psychiatric:  No insomnia, irritability, depression, anxiety, mood  swings, hallucinations.  Endocrine no intolerance to heat and cold, no frequent hunger, urination, thirst.  No changes sex drive.  Hematologic no easy bleeding, easy bruising, anemia, abnormal swollen areas.  Allergy/immunology:  No seasonal allergies, hay fever, itching, frequent infections, no exposure to HIV.    Physical Examination:  Vitals:    03/13/24 1544   BP: 107/72   Pulse: 92   Temp: 36.7 C (98.1 F)   TempSrc: Thermal Scan   SpO2: 99%   Weight: 90.7 kg (199 lb 15.3 oz)   Height: 1.702 m (5' 7)   BMI: 31.32         Body mass index is 31.32 kg/m.      General: no acute distress  Constitutional:  Well developed, well nourished, alert and oriented   Skin: intact, without lesions  Lungs: non-labored respirations  CV: RRR, extremities wwp  Abdomen: soft, NT, ND, no masses, well healed laparoscopic scars  Neurologic: alert and oriented, no focal deficits noted  Psychiatric: mood stable, normal affect, alert and oriented    Gynecologic:    Breasts: Symmetric. No skin or nipple changes or discharge. No palpable masses. No lymphadenopathy.   Urethra: no lesions, masses, tenderness  Vulva: normal development, no skin changes, no lesions. Notably L labia majora with some asymmetry compared to R.  Vagina: well-estrogenated mucosa with no bleeding, discharge.  Cervix:: normal appeareance and texture, no masses.  Uterus: retroverted, freely mobile, non-tender   Adnexa: no masses, no nodularity or tenderness.  Culdesac: no tenderness  Rectal: deferred       ICD-10-CM    1. Abnormal uterine bleeding  N93.9 CYTOPATHOLOGY, GYN +/- HIGH RISK HPV     THYROID STIMULATING HORMONE WITH FREE T4 REFLEX     DEHYDROEPIANDROSTERONE (DHEA), SERUM     TESTOSTERONE FREE (DIALYSIS) AND TOTAL,MS     OBG US  76830 TRANSVAGINAL GYN ( TV)      2. Dysmenorrhea  N94.6 OBG US  76830 TRANSVAGINAL GYN ( TV)         Orders Placed This Encounter    OBG US  23169 TRANSVAGINAL GYN ( TV)    THYROID STIMULATING HORMONE WITH FREE T4 REFLEX     DEHYDROEPIANDROSTERONE (DHEA),  SERUM    TESTOSTERONE FREE (DIALYSIS) AND TOTAL,MS    CYTOPATHOLOGY, GYN +/- HIGH RISK HPV          27 y.o. No obstetric history on file. presents for annual exam.     Annual exam  - Pap smear collected and sent. STI screening not indicated  - Discussed breast awareness.    2. Abnormal uterine bleeding, dysmenorrhea  - Discussed reasons for abnormal uterine bleeding including structural and medical.   - Will order US  to rule out structural cause including polyp, leiomyoma.   - Possibility of anovulatory cycles contributing - will order DHEAS and free/total testosterone as she has no clinical evidence of androgen excess today.   - Discussed endometriosis and need for diagnostic laparoscopy to further assess if this is causing her pain. Discussed that I would refer her to a specialist for excision of endometriosis and she is agreeable to this.   - Discussed management of symptoms with NSAIDs scheduled first few days of her period, which she is agreeable to.   - Will plan f/u in 1 month after all testing is done to review and go over next steps.     3. Vaginal bump?   - Discussed this is probably variant of her anatomy    On the day of the encounter, a total of 30 minutes were spent on this patient encounter including review of historical data, examination, documentation and post-visit activities. This time documented excludes procedural time.    Connell Rubens, MD

## 2024-03-18 ENCOUNTER — Ambulatory Visit (INDEPENDENT_AMBULATORY_CARE_PROVIDER_SITE_OTHER): Payer: Self-pay

## 2024-03-18 LAB — CYTOPATHOLOGY, GYN +/- HIGH RISK HPV

## 2024-04-10 ENCOUNTER — Ambulatory Visit

## 2024-04-10 ENCOUNTER — Other Ambulatory Visit: Payer: Self-pay

## 2024-04-10 DIAGNOSIS — N939 Abnormal uterine and vaginal bleeding, unspecified: Secondary | ICD-10-CM | POA: Insufficient documentation

## 2024-04-10 LAB — THYROID STIMULATING HORMONE WITH FREE T4 REFLEX: TSH: 1.088 u[IU]/mL (ref 0.350–4.940)

## 2024-04-14 ENCOUNTER — Other Ambulatory Visit: Payer: Self-pay

## 2024-04-14 ENCOUNTER — Other Ambulatory Visit (INDEPENDENT_AMBULATORY_CARE_PROVIDER_SITE_OTHER): Payer: Self-pay

## 2024-04-14 DIAGNOSIS — N946 Dysmenorrhea, unspecified: Secondary | ICD-10-CM

## 2024-04-14 DIAGNOSIS — N939 Abnormal uterine and vaginal bleeding, unspecified: Secondary | ICD-10-CM

## 2024-04-15 LAB — TESTOSTERONE FREE (DIALYSIS) AND TOTAL,MS
TESTOSTERONE, FREE: 1.9 pg/mL (ref 0.1–6.4)
TESTOSTERONE,TOTAL,LC/MS/MS: 28 ng/dL (ref 2–45)

## 2024-04-16 ENCOUNTER — Encounter (HOSPITAL_BASED_OUTPATIENT_CLINIC_OR_DEPARTMENT_OTHER): Payer: Self-pay

## 2024-04-16 DIAGNOSIS — N939 Abnormal uterine and vaginal bleeding, unspecified: Secondary | ICD-10-CM

## 2024-04-19 LAB — DEHYDROEPIANDROSTERONE (DHEA), SERUM: DHEA, UNCONJUGATED: 184 ng/dL

## 2024-04-22 ENCOUNTER — Encounter (HOSPITAL_COMMUNITY): Payer: Self-pay

## 2024-04-30 ENCOUNTER — Ambulatory Visit (HOSPITAL_COMMUNITY)

## 2024-04-30 ENCOUNTER — Other Ambulatory Visit: Payer: Self-pay

## 2024-04-30 ENCOUNTER — Encounter (HOSPITAL_COMMUNITY): Payer: Self-pay

## 2024-04-30 ENCOUNTER — Encounter (HOSPITAL_COMMUNITY): Admission: RE | Disposition: A | Discharge status: Home / Self Care | Payer: Self-pay | Source: Ambulatory Visit

## 2024-04-30 ENCOUNTER — Ambulatory Visit
Admission: RE | Admit: 2024-04-30 | Discharge: 2024-04-30 | Disposition: A | Discharge status: Home / Self Care | Source: Ambulatory Visit

## 2024-04-30 DIAGNOSIS — N939 Abnormal uterine and vaginal bleeding, unspecified: Secondary | ICD-10-CM | POA: Diagnosis present

## 2024-04-30 DIAGNOSIS — G9332 Myalgic encephalomyelitis/chronic fatigue syndrome: Secondary | ICD-10-CM | POA: Insufficient documentation

## 2024-04-30 DIAGNOSIS — G43009 Migraine without aura, not intractable, without status migrainosus: Secondary | ICD-10-CM | POA: Insufficient documentation

## 2024-04-30 DIAGNOSIS — R911 Solitary pulmonary nodule: Secondary | ICD-10-CM | POA: Insufficient documentation

## 2024-04-30 DIAGNOSIS — Z9889 Other specified postprocedural states: Secondary | ICD-10-CM | POA: Insufficient documentation

## 2024-04-30 DIAGNOSIS — N840 Polyp of corpus uteri: Secondary | ICD-10-CM

## 2024-04-30 HISTORY — DX: Post-traumatic stress disorder, unspecified: F43.10

## 2024-04-30 HISTORY — DX: Panic disorder (episodic paroxysmal anxiety): F41.0

## 2024-04-30 HISTORY — DX: Unintended awareness under general anesthesia during procedure, initial encounter: T88.53XA

## 2024-04-30 HISTORY — DX: Anxiety disorder, unspecified: F41.9

## 2024-04-30 HISTORY — DX: Motion sickness, initial encounter: T75.3XXA

## 2024-04-30 HISTORY — DX: Spontaneous ecchymoses: R23.3

## 2024-04-30 HISTORY — DX: Unspecified asthma, uncomplicated: J45.909

## 2024-04-30 HISTORY — DX: Presence of spectacles and contact lenses: Z97.3

## 2024-04-30 HISTORY — PX: COMBINED HYSTEROSCOPY DIAGNOSTIC / D&C: SUR297

## 2024-04-30 HISTORY — DX: Other general symptoms and signs: R68.89

## 2024-04-30 LAB — HCG, SERUM QUALITATIVE, PREGNANCY: PREGNANCY, SERUM QUALITATIVE: NEGATIVE

## 2024-04-30 SURGERY — HYSTEROSCOPY WITH DILATION AND CURETTAGE
Anesthesia: Monitor Anesthesia Care | Wound class: Clean Contaminated Wounds-The respiratory, GI, Genital, or urinary

## 2024-04-30 MED ORDER — MIDAZOLAM 1 MG/ML INJECTION WRAPPER
Freq: Once | INTRAMUSCULAR | Status: DC | PRN
Start: 2024-04-30 — End: 2024-04-30
  Administered 2024-04-30: 2 mg via INTRAVENOUS

## 2024-04-30 MED ORDER — KETOROLAC 30 MG/ML (1 ML) INJECTION SOLUTION
Freq: Once | INTRAMUSCULAR | Status: DC | PRN
Start: 2024-04-30 — End: 2024-04-30
  Administered 2024-04-30: 30 mg via INTRAVENOUS

## 2024-04-30 MED ORDER — FENTANYL (PF) 50 MCG/ML INJECTION SOLUTION
Freq: Once | INTRAMUSCULAR | Status: DC | PRN
Start: 2024-04-30 — End: 2024-04-30
  Administered 2024-04-30 (×2): 25 ug via INTRAVENOUS
  Administered 2024-04-30: 50 ug via INTRAVENOUS

## 2024-04-30 MED ORDER — LIDOCAINE HCL 10 MG/ML (1 %) INJECTION SOLUTION
Freq: Once | INTRAMUSCULAR | Status: DC | PRN
Start: 2024-04-30 — End: 2024-04-30
  Administered 2024-04-30: 20 mL via INTRAMUSCULAR

## 2024-04-30 MED ORDER — SODIUM CHLORIDE 0.9 % (FLUSH) INJECTION SYRINGE
3.0000 mL | INJECTION | Freq: Three times a day (TID) | INTRAMUSCULAR | Status: DC
Start: 2024-04-30 — End: 2024-04-30

## 2024-04-30 MED ORDER — ONDANSETRON HCL (PF) 4 MG/2 ML INJECTION SOLUTION
Freq: Once | INTRAMUSCULAR | Status: DC | PRN
Start: 2024-04-30 — End: 2024-04-30
  Administered 2024-04-30: 4 mg via INTRAVENOUS

## 2024-04-30 MED ORDER — SODIUM CHLORIDE 0.9 % IRRIGATION SOLUTION
Freq: Once | Status: DC | PRN
Start: 2024-04-30 — End: 2024-04-30
  Administered 2024-04-30: 3000 mL

## 2024-04-30 MED ORDER — KETAMINE 50 MG/5 ML (10 MG/ML) IN 0.9 % SODIUM CHLORIDE IV SYRINGE
INJECTION | Freq: Once | INTRAVENOUS | Status: DC | PRN
Start: 2024-04-30 — End: 2024-04-30
  Administered 2024-04-30: 20 mg via INTRAVENOUS

## 2024-04-30 MED ORDER — PROPOFOL 10 MG/ML INTRAVENOUS EMULSION
INTRAVENOUS | Status: DC | PRN
Start: 2024-04-30 — End: 2024-04-30
  Administered 2024-04-30: 150 ug/kg/min via INTRAVENOUS
  Administered 2024-04-30: 200 ug/kg/min via INTRAVENOUS
  Administered 2024-04-30: 0 ug/kg/min via INTRAVENOUS
  Administered 2024-04-30: 200 ug/kg/min via INTRAVENOUS
  Administered 2024-04-30: 175 ug/kg/min via INTRAVENOUS

## 2024-04-30 MED ORDER — SODIUM CHLORIDE 0.9 % (FLUSH) INJECTION SYRINGE
3.0000 mL | INJECTION | INTRAMUSCULAR | Status: DC | PRN
Start: 2024-04-30 — End: 2024-04-30

## 2024-04-30 MED ORDER — LACTATED RINGERS INTRAVENOUS SOLUTION
INTRAVENOUS | Status: DC
Start: 2024-04-30 — End: 2024-04-30
  Administered 2024-04-30 (×2): 0 via INTRAVENOUS

## 2024-04-30 MED ORDER — ACETAMINOPHEN 1,000 MG/100 ML (10 MG/ML) INTRAVENOUS SOLUTION
Freq: Once | INTRAVENOUS | Status: DC | PRN
Start: 2024-04-30 — End: 2024-04-30
  Administered 2024-04-30: 1000 mg via INTRAVENOUS

## 2024-04-30 MED ORDER — LIDOCAINE HCL 20 MG/ML (2 %) INJECTION SOLUTION
Freq: Once | INTRAMUSCULAR | Status: DC | PRN
Start: 2024-04-30 — End: 2024-04-30
  Administered 2024-04-30: 40 mg via INTRAVENOUS

## 2024-04-30 MED ORDER — FAMOTIDINE (PF) 20 MG/2 ML INTRAVENOUS SOLUTION
20.0000 mg | Freq: Once | INTRAVENOUS | Status: AC
Start: 2024-04-30 — End: 2024-04-30
  Administered 2024-04-30: 20 mg via INTRAVENOUS
  Filled 2024-04-30: qty 2

## 2024-04-30 MED ORDER — LIDOCAINE HCL 10 MG/ML (1 %) INJECTION SOLUTION
INTRAMUSCULAR | Status: AC
Start: 2024-04-30 — End: 2024-04-30
  Filled 2024-04-30: qty 20

## 2024-04-30 SURGICAL SUPPLY — 15 items
DEVICE TISS REM AVETA 2.9MM RESCT SMOL STRL LF  DISP (ENDOSCOPIC SUPPLIES) ×1 IMPLANT
DEVICE TISSUE REMOVAL AVETA HYSTEROSCOPE CORAL 4.6MM STRL LF DISP (ENDOSCOPIC SUPPLIES) ×1 IMPLANT
DRAPE OB LITH UNDR BUTT FLUID COLLECT PCH SUCT PORT GRAD MRK ADH CLSR 44X40IN STRL SURG POLY CLR (DRAPE/PACKS/SHEETS/OR TOWEL) ×1 IMPLANT
DRAPE TEAR RST LOW LINT 76X55IN LRG STRL SURG (DRAPE/PACKS/SHEETS/OR TOWEL) ×1 IMPLANT
GOWN SURG LRG STD LGTH AAMI L4 BRTHBL HKLP CLSR LINT RST STRL AERO CR SMS FILM (DRAPE/PACKS/SHEETS/OR TOWEL) ×1 IMPLANT
LEGGINGS SURG 48X31IN FLUID COLLECT PCH SUCT DRAIN CUF SMS 6IN STRL (DRAPE/PACKS/SHEETS/OR TOWEL) IMPLANT
PAD DRESS 8X3IN MDCHC NONADH NWVN LF  STRL DISP WHT (WOUND CARE SUPPLY) ×1 IMPLANT
PAD SURG PREP 48X24IN SMS CUF NONST LF (MED SURG SUPPLIES) ×1 IMPLANT
SOL IRRG 0.9% NACL 3L PRSV FR FLXB CONTAINR STRL LF (MEDICATIONS/SOLUTIONS) ×1 IMPLANT
SPONGE SURG 8X4IN 12 PLY RADOPQ BAND VISTEC STRL LF  BLU WHT (MED SURG SUPPLIES) ×1 IMPLANT
SYRINGE LL 10ML LF  STRL CONTROL CONCEN TIP PRGN FREE DEHP-FR MED DISP (MED SURG SUPPLIES) ×1 IMPLANT
SYSTEM FLUID MANAGEMENT AVETA STERILE LATEX FREE DISPOSABLE (ENDOSCOPIC SUPPLIES) ×1 IMPLANT
SYSTEM WASTE MANAGEMENT AVETA CAP 3.0 NONSTERILE LF DISP (MED SURG SUPPLIES) IMPLANT
TRAY LITHOTOMY ~~LOC~~ - ~~LOC~~ HOSPITAL (CUSTOM TRAYS & PACK) IMPLANT
WATER STRL 1000ML PRSV FR N-PYRG DEHP-FR PLASTIC PR BTL AQLT LF (MED SURG SUPPLIES) ×1 IMPLANT

## 2024-04-30 NOTE — OR Surgeon (Signed)
 [facility] Department of Obstetric & Gynecology     OPERATIVE REPORT     Patient Name: Yvonne Merritt Number: [id number]  Date of Service: 04/30/2024    Date of Birth: 19/27/1997        Pre-Operative Diagnosis: Abnormal uterine bleeding     Post-Operative Diagnosis: Endometrial polyp     Procedure(s)/Description:  D&C hysteroscopy, polypectomy     Findings: Normal appearing uterine cavity with mass of polypoid tissue in the middle, fundal aspect of cavity. Normal vagina and cervix.     Attending Surgeon: Dr. Oral, MD  Assistant(s): n/a     Anesthesia Type: MAC     Estimated Blood Loss:  Minimal  Blood Given: None  Fluids Given: LR crystalloid per Anesthesia     Complications:  None     Tubes: None  Drains: None  Specimens/ Cultures: endometrial curettings  Implants: None           Disposition: PACU - hemodynamically stable.           Condition: Stable     Description of Procedure: After informed consent was obtained, the patient was taken to the operating room and placed on the operating table in the dorsal lithotomy position.  Sequential compression devices were placed on the patient's lower extremities and activated. The patient was then placed under MAC sedation. Vaginal prep was performed with betadine solution. A surgical pause was performed identifying the patient and procedure to be done as well as indications and possible complications and all were in agreement.     A speculum was placed in the vagina and the cervix was visualized. The cervix was grasped with a single-tooth tenaculum. A paracervical block was performed with 20cc of 1% lidocaine. The cervix was gently dilated to 21 French with Pratt dilators with ease. The hysteroscope was then placed into the uterine cavity with the above findings.      Using the morcellator, the polypoid-appearing tissue was resected and sent to pathology. Good hemostasis viewed. The hysteroscope was removed and sharp curettage was performed.  All curettings sent to pathology. The tenaculum was removed and hemostasis was adequate. All instruments were removed from the vagina.      Total deficit 50cc, EBL 5cc. All instrument counts were correct at the end of the case. The patient tolerated the procedure well and taken to PACU in stable condition.    Connell Oral, MD

## 2024-04-30 NOTE — OR PostOp (Signed)
 Discharge instructions reviewed with the patient and her husband. Both vu of all.
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
## 2024-04-30 NOTE — Discharge Instructions (Addendum)
 SURGICAL DISCHARGE INSTRUCTIONS     Dr. Oral Needle, MD  performed your [facility] W/HYSTEROSCOPY with polypectomy today at the [facility]    Dr. Emmy    PLEASE SEE WRITTEN HANDOUTS AS DISCUSSED BY YOUR NURSE:  Yvonne Merritt    SIGNS AND SYMPTOMS OF A WOUND / INCISION INFECTION   Be sure to watch for the following:  Increase in redness or red streaks near or around the wound or incision.  Increase in pain that is intense or severe and cannot be relieved by the pain medication that your doctor has given you.  Increase in swelling that cannot be relieved by elevation of a body part, or by applying ice, if permitted.  Increase in drainage, or if yellow / green in color and smells bad. This could be on a dressing or a cast.  Increase in fever for longer than 24 hours, or an increase that is higher than 101 degrees Fahrenheit (normal body temperature is 98 degrees Fahrenheit). The incision may feel warm to the touch.    **CALL YOUR DOCTOR IF ONE OR MORE OF THESE SIGNS / SYMPTOMS SHOULD OCCUR.    ANESTHESIA INFORMATION   ANESTHESIA -- ADULT PATIENTS:  You have received intravenous sedation / general anesthesia, and you may feel drowsy and light-headed for several hours. You may even experience some forgetfulness of the procedure. DO NOT DRIVE A MOTOR VEHICLE or perform any activity requiring complete alertness or coordination until you feel fully awake in about 24-48 hours. Do not drink alcoholic beverages for at least 24 hours. Do not stay alone, you must have a responsible adult available to be with you. You may also experience a dry mouth or nausea for 24 hours. This is a normal side effect and will disappear as the effects of the medication wear off.    REMEMBER   If you experience any difficulty breathing, chest pain, bleeding that you feel is excessive, persistent nausea or vomiting or for any other concerns:  Call your physician Dr.  Oral Needle, MD or [phone] You may  also ask to have the general doctor on call paged. They are available to you 24 hours a day.    SPECIAL INSTRUCTIONS / COMMENTS       FOLLOW-UP APPOINTMENTS   Please call your surgeon's office at the number listed about  to schedule a date / time of return.   Discharge Instructions for Dilation and Curettage (D and C)   You had a dilation and curettage (D&C). The reasons for having this procedure vary. It may be done to control heavy uterine bleeding or to find the cause of irregular bleeding. It may also be done to remove pregnancy tissue after an abortion or miscarriage.   Home care  Take it easy. Rest for 2 days as needed.  Go back to your normal activities after 24 to 48 hours. You may also return to work at that time.  Eat a normal diet.  Take an over-the-counter pain reliever if needed.  It's OK to have bleeding for about a week after the D&C. The amount of bleeding should be similar to what you have during a normal period.  Don't drive for 24 hours after the [facility] unless your provider says it's OK.  Don't have sex or use tampons or douches until your healthcare provider says it's safe.     Follow-up  Make a follow-up appointment, or as advised.     When to call your healthcare  provider  Call your healthcare provider right away if you have any of these:   Bleeding that soaks more than 1 sanitary pad in 1 hour  Severe belly pain  Severe cramps  Fever of 100.15F ( 38.0C) or higher, or as directed by your provider  A bad-smelling vaginal discharge  [employer] last reviewed this educational content on 05/02/2021   2000-2024 [employer], [place]. All rights reserved. This information is not intended as a substitute for professional medical care. Always follow your healthcare professional's instructions.

## 2024-04-30 NOTE — Anesthesia Transfer of Care (Signed)
 ANESTHESIA TRANSFER OF CARE   Lisbet Busker is a 28 y.o. ,female, Weight: 89.8 kg (198 lb)   had Procedure(s):  D&C W/HYSTEROSCOPY with polypectomy  performed  04/30/24   Primary Service: Connell O'Connor-Terr*    Past Medical History:   Diagnosis Date    Anxiety     Asthma     Awareness under anesthesia     Bruises easily     Motion sickness     Neck problem     Panic attack     Post traumatic stress disorder (PTSD)     Wears glasses       Allergy History as of 04/30/24       NAPROXEN         Noted Status Severity Type Reaction    07/02/22 0358 Lanette Rong, RN 07/02/22 Active       Comments: Severe depression                   I completed my transfer of care / handoff to the receiving personnel during which we discussed:  Access, Airway, All key/critical aspects of case discussed, Analgesia, Antibiotics, Expectation of post procedure, Fluids/Product, Gave opportunity for questions and acknowledgement of understanding, Labs and PMHx      Post Location: Phase II                                                             Last OR Temp: Temperature: 36 C (96.8 F)      Airway:* No LDAs found *  Blood pressure 127/84, pulse 95, temperature 36 C (96.8 F), resp. rate 12, height 1.676 m (5' 6), weight 89.8 kg (198 lb), SpO2 96%.

## 2024-04-30 NOTE — H&P (Signed)
 [facility]      HISTORY AND PHYSICAL     PATIENT: Yvonne Merritt  CHART NUMBER: [id number]  DATE OF SERVICE: 04/30/2024        HPI: Yvonne Merritt is a 28 y.o. G0P0000 who presents for dilation and curettage, hysteroscopy. She had a recently heavy cycle, notes she is having some cramping and pain going down her leg. She otherwise has no complaints.      PAST MEDICAL HISTORY:  Past Medical History:   Diagnosis Date    Anxiety     Asthma     Awareness under anesthesia     Bruises easily     Motion sickness     Neck problem     Panic attack     Post traumatic stress disorder (PTSD)     Wears glasses            PAST SURGICAL HISTORY:  Past Surgical History:   Procedure Laterality Date    COMBINED HYSTEROSCOPY DIAGNOSTIC / D&C  04/30/2024    Dr. Oral    HX LAP CHOLECYSTECTOMY      HX SKIN BIOPSY      inner thigh, labia    HX UPPER ENDOSCOPY      HX WISDOM TEETH EXTRACTION             FAMILY HISTORY:    FH of BD, MR, SZs or bleeding/clotting disorders  Family Medical History:    None           SOCIAL HISTORY:  Denies drug use  Social History     Tobacco Use    Smoking status: Never    Smokeless tobacco: Never   Substance Use Topics    Alcohol use: Yes     Comment: social        CURRENT MEDICATIONS:   Medications Prior to Admission       Prescriptions    magnesium oxide (MAG-OX) 400 mg Oral Tablet    Take 1 Tablet (400 mg total) by mouth Daily    OTC/HERBAL SUPPLEMENT    prenatal vitamin-iron-folate Tablet    Take 1 Tablet by mouth Daily             ALLERGIES:  Naproxen     REVIEW OF SYSTEMS: Other than ROS in the HPI, all other systems were negative.    PHYSICAL EXAMINATION:   Filed Vitals:    04/30/24 1324   BP: 124/73   Pulse: 76   Resp: 18   Temp: 37.2 C (99 F)   SpO2: 99%       General: appears in good health, comfortable  Lungs: Breathing nonlabored  Cardiovascular: regular rate and rhythm on the monitor  Abdomen: non-distended  Extremities: No cyanosis or  deformity      CURRENT LABS:   Labs:  Lab Results Today:    Results for orders placed or performed during the hospital encounter of 04/30/24 (from the past 24 hours)   HCG, SERUM QUALITATIVE, PREGNANCY   Result Value Ref Range    PREGNANCY, SERUM QUALITATIVE Negative Negative         ASSESSMENT/PLAN: 28 y.o. G0P0000 who presents with abnormal uterine bleeding for D&C hysteroscopy.   - Cleared for procedure as indicated  - No scripts  - Consents signed at bedside    Connell Oral, MD

## 2024-04-30 NOTE — Anesthesia Postprocedure Evaluation (Signed)
 Anesthesia Post Op Evaluation    Patient: Yvonne Merritt  Procedure(s):  D&C W/HYSTEROSCOPY with polypectomy    Last Vitals:Temperature: 36 C (96.8 F) (04/30/24 1545)  Heart Rate: 95 (04/30/24 1545)  BP (Non-Invasive): 123/76 (04/30/24 1606)  Respiratory Rate: 12 (04/30/24 1545)  SpO2: 99 % (04/30/24 1606)    No notable events documented.             Pain management: adequate  Airway patency: patent    Anesthetic complications: no  Cardiovascular status: acceptable  Respiratory status: acceptable  Hydration status: acceptable  Patient post-procedure temperature: Pt Normothermic

## 2024-05-04 DIAGNOSIS — N840 Polyp of corpus uteri: Secondary | ICD-10-CM

## 2024-05-05 ENCOUNTER — Ambulatory Visit (INDEPENDENT_AMBULATORY_CARE_PROVIDER_SITE_OTHER): Payer: Self-pay
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
## 2024-05-18 ENCOUNTER — Ambulatory Visit (INDEPENDENT_AMBULATORY_CARE_PROVIDER_SITE_OTHER): Payer: Self-pay

## 2024-05-18 ENCOUNTER — Other Ambulatory Visit: Payer: Self-pay

## 2024-05-18 ENCOUNTER — Encounter (INDEPENDENT_AMBULATORY_CARE_PROVIDER_SITE_OTHER): Payer: Self-pay

## 2024-05-18 VITALS — BP 122/80 | Wt 205.2 lb

## 2024-05-18 DIAGNOSIS — Z4889 Encounter for other specified surgical aftercare: Secondary | ICD-10-CM
